# Patient Record
Sex: Male | Born: 1950 | Race: White | Hispanic: No | Smoking: Former smoker
Health system: Southern US, Community
[De-identification: ages and names within clinical notes are randomized; demographics above are authoritative.]

## PROBLEM LIST (undated history)

## (undated) DIAGNOSIS — C801 Malignant (primary) neoplasm, unspecified: Secondary | ICD-10-CM

## (undated) DIAGNOSIS — E119 Type 2 diabetes mellitus without complications: Secondary | ICD-10-CM

## (undated) DIAGNOSIS — I10 Essential (primary) hypertension: Secondary | ICD-10-CM

## (undated) DIAGNOSIS — I252 Old myocardial infarction: Secondary | ICD-10-CM

## (undated) DIAGNOSIS — E7800 Pure hypercholesterolemia, unspecified: Secondary | ICD-10-CM

## (undated) HISTORY — PX: KIDNEY SURGERY: SHX687

## (undated) HISTORY — PX: CARDIAC SURGERY: SHX584

---

## 2016-03-01 ENCOUNTER — Encounter (HOSPITAL_COMMUNITY): Payer: Self-pay | Admitting: Emergency Medicine

## 2016-03-01 ENCOUNTER — Emergency Department (HOSPITAL_COMMUNITY)

## 2016-03-01 ENCOUNTER — Emergency Department (HOSPITAL_COMMUNITY)
Admission: EM | Admit: 2016-03-01 | Discharge: 2016-03-01 | Disposition: A | Discharge status: Home / Self Care | Attending: Emergency Medicine | Admitting: Emergency Medicine

## 2016-03-01 DIAGNOSIS — Z85528 Personal history of other malignant neoplasm of kidney: Secondary | ICD-10-CM | POA: Insufficient documentation

## 2016-03-01 DIAGNOSIS — R600 Localized edema: Secondary | ICD-10-CM | POA: Diagnosis not present

## 2016-03-01 DIAGNOSIS — M79604 Pain in right leg: Secondary | ICD-10-CM | POA: Diagnosis present

## 2016-03-01 DIAGNOSIS — I10 Essential (primary) hypertension: Secondary | ICD-10-CM | POA: Diagnosis not present

## 2016-03-01 DIAGNOSIS — Z79899 Other long term (current) drug therapy: Secondary | ICD-10-CM | POA: Insufficient documentation

## 2016-03-01 DIAGNOSIS — R200 Anesthesia of skin: Secondary | ICD-10-CM | POA: Insufficient documentation

## 2016-03-01 DIAGNOSIS — E119 Type 2 diabetes mellitus without complications: Secondary | ICD-10-CM | POA: Insufficient documentation

## 2016-03-01 DIAGNOSIS — M79605 Pain in left leg: Secondary | ICD-10-CM

## 2016-03-01 DIAGNOSIS — Z794 Long term (current) use of insulin: Secondary | ICD-10-CM | POA: Diagnosis not present

## 2016-03-01 DIAGNOSIS — Z7982 Long term (current) use of aspirin: Secondary | ICD-10-CM | POA: Diagnosis not present

## 2016-03-01 HISTORY — DX: Old myocardial infarction: I25.2

## 2016-03-01 HISTORY — DX: Type 2 diabetes mellitus without complications: E11.9

## 2016-03-01 HISTORY — DX: Malignant (primary) neoplasm, unspecified: C80.1

## 2016-03-01 HISTORY — DX: Pure hypercholesterolemia, unspecified: E78.00

## 2016-03-01 HISTORY — DX: Essential (primary) hypertension: I10

## 2016-03-01 LAB — CBC WITH DIFFERENTIAL/PLATELET
Basophils Absolute: 0 10*3/uL (ref 0.0–0.1)
Basophils Relative: 0 %
Eosinophils Absolute: 0.5 10*3/uL (ref 0.0–0.7)
Eosinophils Relative: 4 %
HEMATOCRIT: 29.3 % — AB (ref 39.0–52.0)
HEMOGLOBIN: 9.7 g/dL — AB (ref 13.0–17.0)
LYMPHS ABS: 1.9 10*3/uL (ref 0.7–4.0)
Lymphocytes Relative: 18 %
MCH: 28.5 pg (ref 26.0–34.0)
MCHC: 33.1 g/dL (ref 30.0–36.0)
MCV: 86.2 fL (ref 78.0–100.0)
MONOS PCT: 6 %
Monocytes Absolute: 0.6 10*3/uL (ref 0.1–1.0)
NEUTROS PCT: 72 %
Neutro Abs: 7.6 10*3/uL (ref 1.7–7.7)
Platelets: 173 10*3/uL (ref 150–400)
RBC: 3.4 MIL/uL — ABNORMAL LOW (ref 4.22–5.81)
RDW: 15 % (ref 11.5–15.5)
WBC: 10.7 10*3/uL — ABNORMAL HIGH (ref 4.0–10.5)

## 2016-03-01 LAB — URINALYSIS, ROUTINE W REFLEX MICROSCOPIC
BILIRUBIN URINE: NEGATIVE
Bacteria, UA: NONE SEEN
Glucose, UA: NEGATIVE mg/dL
Ketones, ur: NEGATIVE mg/dL
LEUKOCYTES UA: NEGATIVE
Nitrite: NEGATIVE
PH: 5 (ref 5.0–8.0)
Protein, ur: 30 mg/dL — AB
SPECIFIC GRAVITY, URINE: 1.009 (ref 1.005–1.030)

## 2016-03-01 LAB — COMPREHENSIVE METABOLIC PANEL
ALK PHOS: 196 U/L — AB (ref 38–126)
ALT: 24 U/L (ref 17–63)
ANION GAP: 9 (ref 5–15)
AST: 21 U/L (ref 15–41)
Albumin: 3.1 g/dL — ABNORMAL LOW (ref 3.5–5.0)
BILIRUBIN TOTAL: 0.4 mg/dL (ref 0.3–1.2)
BUN: 44 mg/dL — ABNORMAL HIGH (ref 6–20)
CALCIUM: 8.9 mg/dL (ref 8.9–10.3)
CO2: 19 mmol/L — ABNORMAL LOW (ref 22–32)
Chloride: 108 mmol/L (ref 101–111)
Creatinine, Ser: 2.27 mg/dL — ABNORMAL HIGH (ref 0.61–1.24)
GFR calc non Af Amer: 29 mL/min — ABNORMAL LOW (ref >60)
GFR, EST AFRICAN AMERICAN: 34 mL/min — AB (ref >60)
Glucose, Bld: 110 mg/dL — ABNORMAL HIGH (ref 65–99)
Potassium: 4.1 mmol/L (ref 3.5–5.1)
Sodium: 136 mmol/L (ref 135–145)
TOTAL PROTEIN: 7.5 g/dL (ref 6.5–8.1)

## 2016-03-01 LAB — BRAIN NATRIURETIC PEPTIDE: B Natriuretic Peptide: 62 pg/mL (ref 0.0–100.0)

## 2016-03-01 MED ORDER — FUROSEMIDE 40 MG PO TABS
40.0000 mg | ORAL_TABLET | Freq: Once | ORAL | Status: DC
  Filled 2016-03-01: qty 1

## 2016-03-01 NOTE — ED Triage Notes (Signed)
Pain to bilateral legs x 1 1/2 weeks ago,after receiving rocephin shots in bilateral buttocks.  Given abx d/t swelling in lt testicle

## 2016-03-01 NOTE — ED Provider Notes (Signed)
[facility] DEPT Provider Note   CSN: [id number] Arrival date & time: 03/01/16  1550     History   Chief Complaint Chief Complaint  Patient presents with  . Leg Pain    HPI Kirk Wong is a 65 y.o. male.  The history is provided by the patient. No language interpreter was used.  Leg Pain   This is a new problem. The current episode started 2 days ago. The problem occurs constantly. The problem has been rapidly worsening. The pain is present in the right lower leg and left upper leg. The quality of the pain is described as aching. The pain is moderate. Associated symptoms include numbness and limited range of motion. He has tried nothing for the symptoms. The treatment provided no relief.   Pt reports he had a scrotal infection a month ago.  Pt reports he had a shot of rocephin 10 days ago,  Pt reports he had a shot in each buttock.  Pt report he has weakness in his right leg.  Pt reports right arm felt weak earlier.  Pt complains of urinary incontinence earlier today.  Pt reports decreased appetite since infection and taking antibiotics.  Past Medical History:  Diagnosis Date  . Cancer ([place])    lt kidney removed d/t cancer   . Diabetes mellitus without complication ([place])   . Hypercholesteremia   . Hypertension   . 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   Past Surgical History:  Procedure Laterality Date  . CARDIAC SURGERY     4 stents  . KIDNEY SURGERY     lt kidney removed       Home Medications    Prior to Admission medications   Medication Sig Start Date End Date Taking? Authorizing Provider  amLODipine (NORVASC) 5 MG tablet Take 5 mg by mouth daily.   Yes Historical Provider, MD  aspirin EC 81 MG tablet Take 81 mg by mouth daily.   Yes Historical Provider, MD  clopidogrel (PLAVIX) 75 MG tablet Take 75 mg by mouth daily.   Yes Historical Provider, MD  dimenhyDRINATE (DRAMAMINE) 50 MG tablet Take 100 mg  by mouth 2 (two) times daily.   Yes Historical Provider, MD  furosemide (LASIX) 20 MG tablet Take 60 mg by mouth 2 (two) times a week. Mondays and Fridays only   Yes Historical Provider, MD  gemfibrozil (LOPID) 600 MG tablet Take 600 mg by mouth daily.   Yes Historical Provider, MD  insulin NPH-regular Human (NOVOLIN 70/30) (70-30) 100 UNIT/ML injection Inject 20-38 Units into the skin 2 (two) times daily. 38 units in the morning and 20 units in the evening   Yes Historical Provider, MD  losartan (COZAAR) 100 MG tablet Take 100 mg by mouth daily.   Yes Historical Provider, MD  metoprolol (LOPRESSOR) 50 MG tablet Take 50 mg by mouth 2 (two) times daily.   Yes Historical Provider, MD  Omega-3 Fatty Acids (FISH OIL) 1000 MG CAPS Take 2 capsules by mouth 2 (two) times daily.   Yes Historical Provider, MD  sodium bicarbonate 650 MG tablet Take 650 mg by mouth 2 (two) times daily.   Yes Historical Provider, MD    Family History No family history on file.  Social History Social History  Substance Use Topics  . Smoking status: Former Research scientist (life sciences)  . Smokeless tobacco: Never Used     Comment: quit in 2004  .  Alcohol use No     Allergies   Patient has no known allergies.   Review of Systems Review of Systems  Neurological: Positive for numbness.  All other systems reviewed and are negative.    Physical Exam Updated Vital Signs BP 177/81 (BP Location: Left Wrist)   Pulse 81   Temp 98.4 F (36.9 C) (Oral)   Resp 18   Ht 5\' 10"  (1.778 m)   Wt 104.3 kg   SpO2 99%   BMI 33.00 kg/m   Physical Exam  Constitutional: He is oriented to person, place, and time. He appears well-developed and well-nourished.  HENT:  Head: Normocephalic and atraumatic.  Nose: Nose normal.  Mouth/Throat: Oropharynx is clear and moist.  Eyes: Conjunctivae are normal. Pupils are equal, round, and reactive to light.  Neck: Neck supple.  Cardiovascular: Normal rate and regular rhythm.   No murmur  heard. Pulmonary/Chest: Effort normal and breath sounds normal. No respiratory distress.  Abdominal: Soft. There is no tenderness.  Musculoskeletal: He exhibits no edema.  Neurological: He is alert and oriented to person, place, and time. He displays normal reflexes. No cranial nerve deficit or sensory deficit. He exhibits normal muscle tone. Coordination normal.  Skin: Skin is warm and dry.  Psychiatric: He has a normal mood and affect.  Nursing note and vitals reviewed.    ED Treatments / Results  Labs (all labs ordered are listed, but only abnormal results are displayed) Labs Reviewed  CBC WITH DIFFERENTIAL/PLATELET - Abnormal; Notable for the following:       Result Value   WBC 10.7 (*)    RBC 3.40 (*)    Hemoglobin 9.7 (*)    HCT 29.3 (*)    All other components within normal limits  COMPREHENSIVE METABOLIC PANEL - Abnormal; Notable for the following:    CO2 19 (*)    Glucose, Bld 110 (*)    BUN 44 (*)    Creatinine, Ser 2.27 (*)    Albumin 3.1 (*)    Alkaline Phosphatase 196 (*)    GFR calc non Af Amer 29 (*)    GFR calc Af Amer 34 (*)    All other components within normal limits  BRAIN NATRIURETIC PEPTIDE  URINALYSIS, ROUTINE W REFLEX MICROSCOPIC    EKG  EKG Interpretation  Date/Time:  Sunday March 01 2016 16:59:47 EST Ventricular Rate:  83 PR Interval:    QRS Duration: 92 QT Interval:  350 QTC Calculation: 412 R Axis:   32 Text Interpretation:  Sinus rhythm Borderline T abnormalities, inferior leads Baseline wander in lead(s) V6 No STEMI.  Confirmed by LONG MD, JOSHUA [phone]) on 03/01/2016 5:35:46 PM       Radiology Ct Head Wo Contrast  Result Date: 03/01/2016 CLINICAL DATA:  Bilateral leg pain for several weeks, initial encounter EXAM: CT HEAD WITHOUT CONTRAST TECHNIQUE: Contiguous axial images were obtained from the base of the skull through the vertex without intravenous contrast. COMPARISON:  None. FINDINGS: Brain: No evidence of acute infarction,  hemorrhage, hydrocephalus, extra-axial collection or mass lesion/mass effect. Vascular: No hyperdense vessel or unexpected calcification. Skull: Normal. Negative for fracture or focal lesion. Sinuses/Orbits: No acute finding. Other: None. IMPRESSION: No acute intracranial abnormality noted. Electronically Signed   By: Inez Catalina M.D.   On: 03/01/2016 17:45   Ct Thoracic Spine Wo Contrast  Result Date: 03/01/2016 CLINICAL DATA:  Bilateral leg pain since 1-1/2 weeks ago after receiving Rocephin shots in both buttocks. Numbness in the right leg. EXAM: CT  THORACIC SPINE WITHOUT CONTRAST; CT LUMBAR SPINE WITHOUT CONTRAST TECHNIQUE: Multidetector CT images of the thoracic were obtained using the standard protocol without intravenous contrast. COMPARISON:  None. FINDINGS: Alignment: Normal. Vertebrae: No acute fracture or focal pathologic process. Small osteophytes noted anteriorly along the thoracolumbar spine, more prominently from L3 through L5. Paraspinal and other soft tissues: No paraspinal hematoma. There is aortic atherosclerosis of the thoracic and abdominal aorta is with mild fusiform ectasia of the infrarenal abdominal aorta. There is extension of atherosclerosis into the common iliac arteries and branch vessels. Disc levels: No thoracic disc herniation or canal stenosis. No spinal canal hemorrhage or focal mass. No focal disc herniation or significant canal stenosis from L1 through L3. L3-4 mild central disc bulge without significant canal stenosis. No significant neural foraminal stenosis. L4-5 mild central disc bulge without significant canal nor neural foraminal stenosis. At L5-S1 mild central disc bulge without significant canal or neural foraminal stenosis. IMPRESSION: No acute osseous abnormality of the thoracolumbar spine. Mild central disc bulges from L3 through S1 without significant neural foraminal or central canal stenosis. Aortoiliac and branch vessel atherosclerosis. Electronically Signed    By: Ashley Royalty M.D.   On: 03/01/2016 18:27   Ct Lumbar Spine Wo Contrast  Result Date: 03/01/2016 CLINICAL DATA:  Bilateral leg pain since 1-1/2 weeks ago after receiving Rocephin shots in both buttocks. Numbness in the right leg. EXAM: CT THORACIC SPINE WITHOUT CONTRAST; CT LUMBAR SPINE WITHOUT CONTRAST TECHNIQUE: Multidetector CT images of the thoracic were obtained using the standard protocol without intravenous contrast. COMPARISON:  None. FINDINGS: Alignment: Normal. Vertebrae: No acute fracture or focal pathologic process. Small osteophytes noted anteriorly along the thoracolumbar spine, more prominently from L3 through L5. Paraspinal and other soft tissues: No paraspinal hematoma. There is aortic atherosclerosis of the thoracic and abdominal aorta is with mild fusiform ectasia of the infrarenal abdominal aorta. There is extension of atherosclerosis into the common iliac arteries and branch vessels. Disc levels: No thoracic disc herniation or canal stenosis. No spinal canal hemorrhage or focal mass. No focal disc herniation or significant canal stenosis from L1 through L3. L3-4 mild central disc bulge without significant canal stenosis. No significant neural foraminal stenosis. L4-5 mild central disc bulge without significant canal nor neural foraminal stenosis. At L5-S1 mild central disc bulge without significant canal or neural foraminal stenosis. IMPRESSION: No acute osseous abnormality of the thoracolumbar spine. Mild central disc bulges from L3 through S1 without significant neural foraminal or central canal stenosis. Aortoiliac and branch vessel atherosclerosis. Electronically Signed   By: Ashley Royalty M.D.   On: 03/01/2016 18:27    Procedures Procedures (including critical care time)  Medications Ordered in ED Medications - No data to display   Initial Impression / Assessment and Plan / ED Course  I have reviewed the triage vital signs and the nursing notes.  Pertinent labs & imaging  results that were available during my care of the patient were reviewed by me and considered in my medical decision making (see chart for details).  Clinical Course     Dr. Laverta Baltimore in to see and examine.   Labs compaired with old labs.  Creatine in pt's usual range.  Ct show bulging disc.  No nerve impingement.    Final Clinical Impressions(s) / ED Diagnoses   Final diagnoses:  Bilateral edema of lower extremity  Right leg pain  Left leg pain    New Prescriptions New Prescriptions   No medications on file  Fransico Meadow, PA-C 03/01/16 [place], PA-C 03/01/16 [place], MD 03/03/16 [phone]

## 2016-03-01 NOTE — ED Notes (Signed)
Pt refused to take lasix. Pt states he "didn't want to be up all night urinating."

## 2016-03-01 NOTE — ED Notes (Signed)
Pt wheeled from ER in police custody. Pt verbalized understanding of discharge instructions.

## 2016-04-09 DEATH — deceased

## 2017-09-19 IMAGING — CT CT L SPINE W/O CM
2 of 3 series · 9 of 33 positions shown, 11 images · non-contrast
Comparison: None.

CLINICAL DATA: Bilateral leg pain since [DATE] weeks ago after
receiving Rocephin shots in both buttocks. Numbness in the right
leg.

EXAM:
CT THORACIC SPINE WITHOUT CONTRAST; CT LUMBAR SPINE WITHOUT CONTRAST
TECHNIQUE: Multidetector CT images of the thoracic were obtained using the
standard protocol without intravenous contrast.

[Series 4: l spine soft · axial · 0.33mm/px · z∈[-498,-108]mm · 6 of 255 slices shown, 8 images]
[im 40/255  soft-tissue]
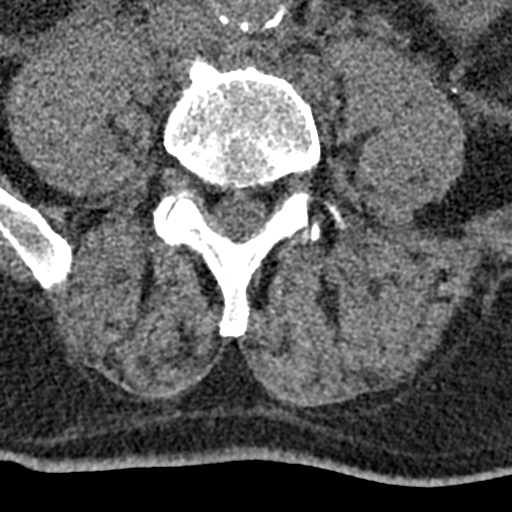
[im 40/255  bone]
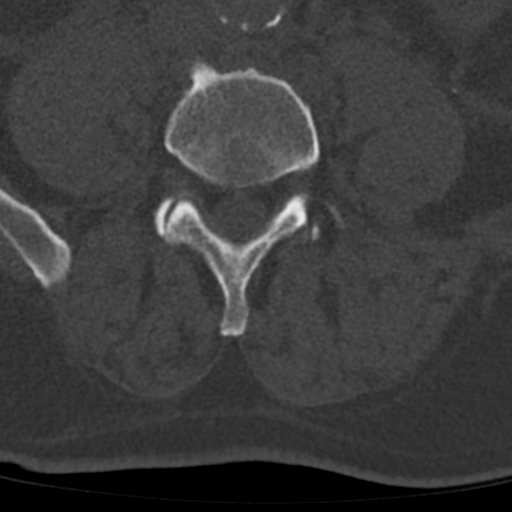
[im 79/255  bone]
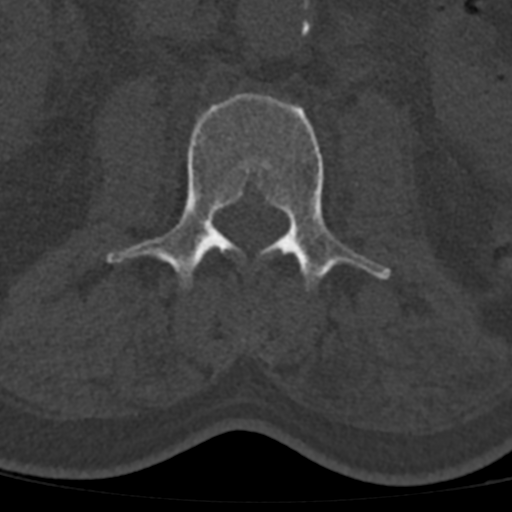
[im 118/255  bone]
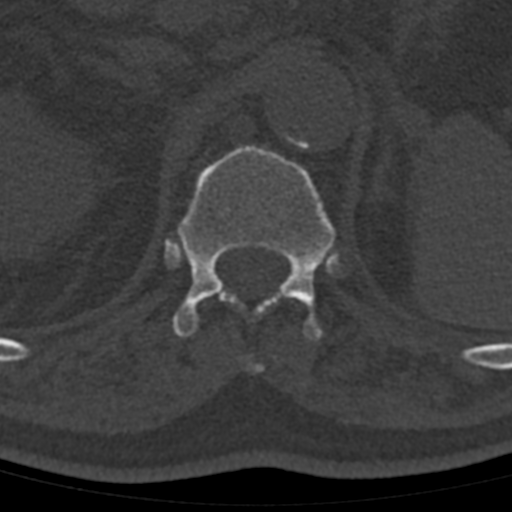
[im 157/255  bone]
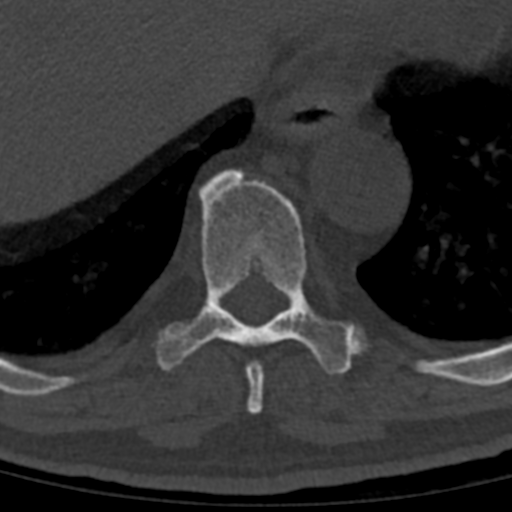
[im 196/255  soft-tissue]
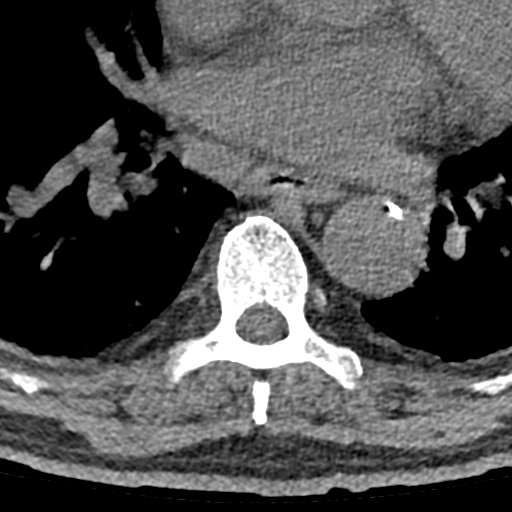
[im 196/255  bone]
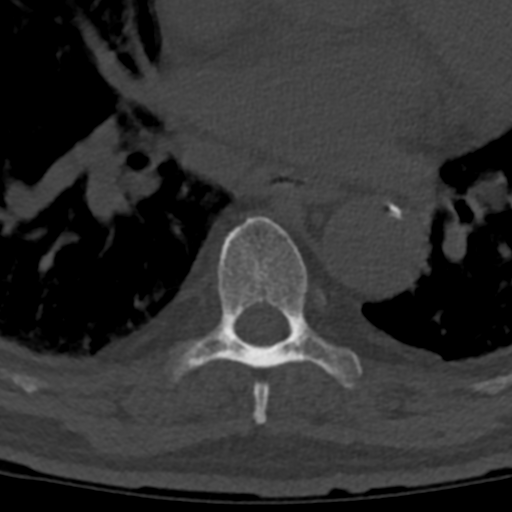
[im 235/255  bone]
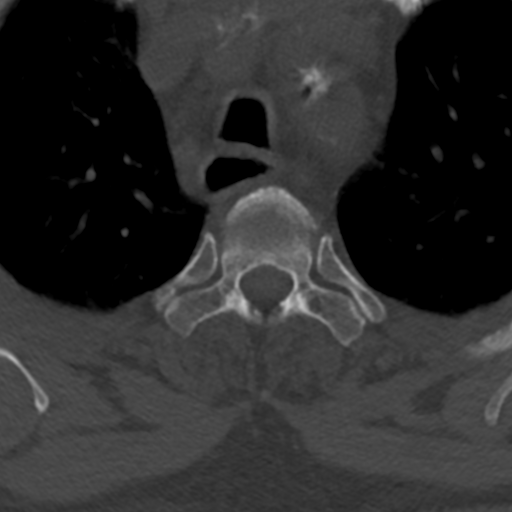

[Series 6: coronal bone · coronal · 0.24mm/px · 3 of 74 slices shown]
[im 15/74  bone]
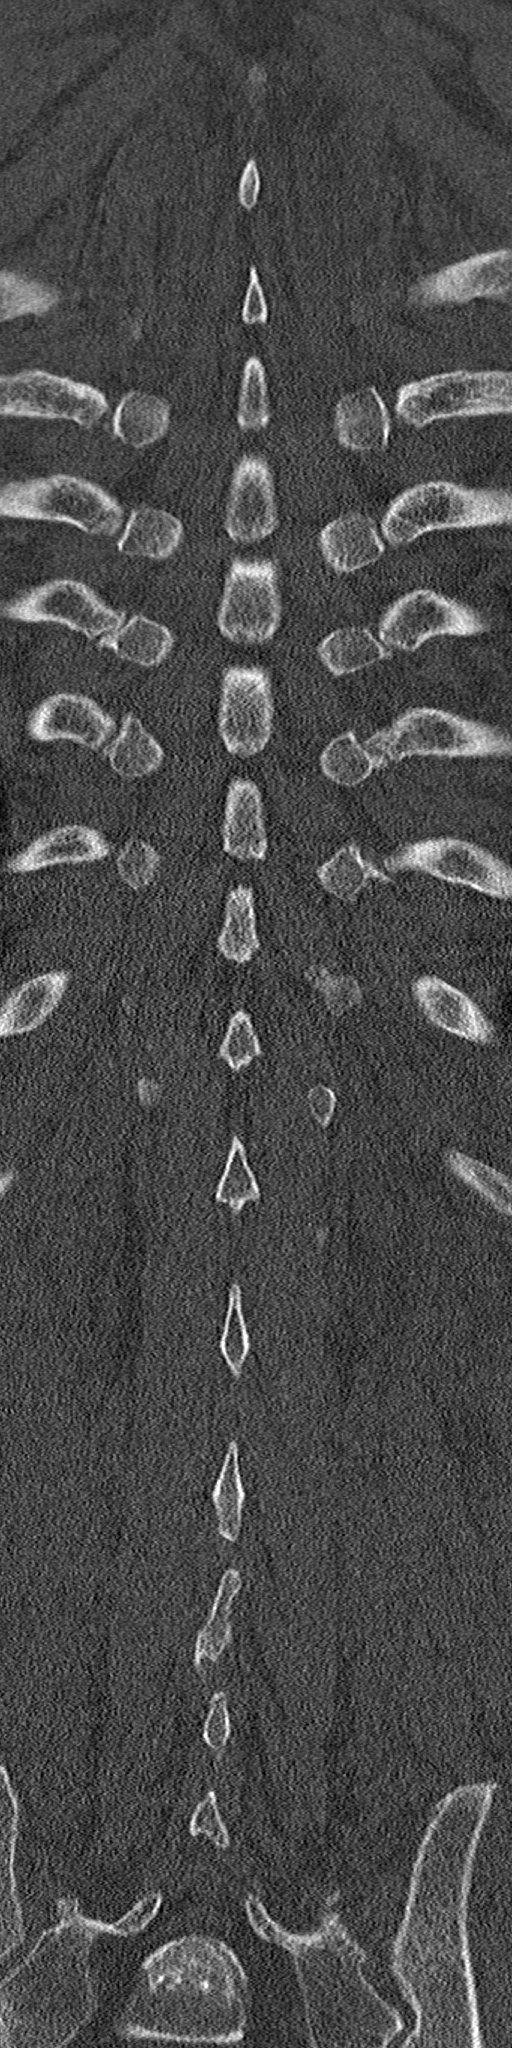
[im 30/74  bone]
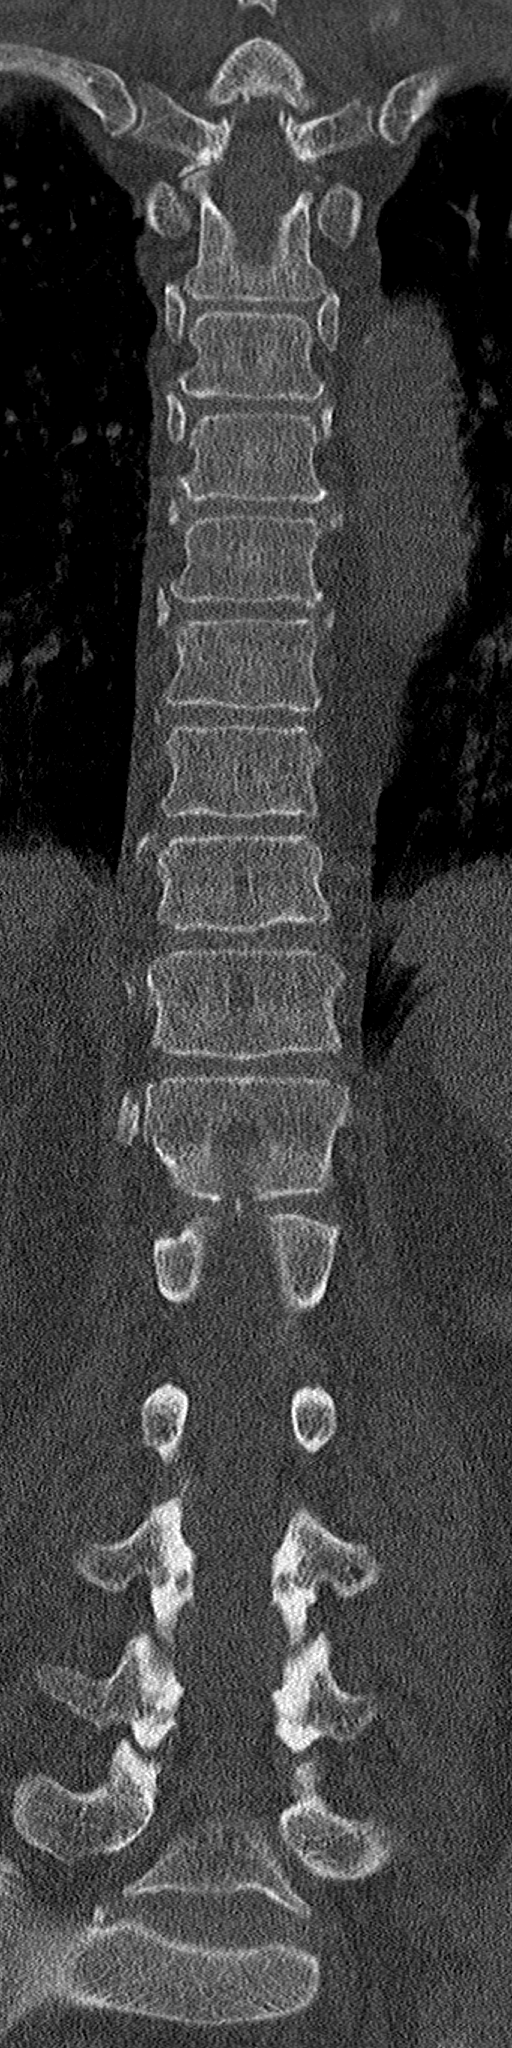
[im 44/74  bone]
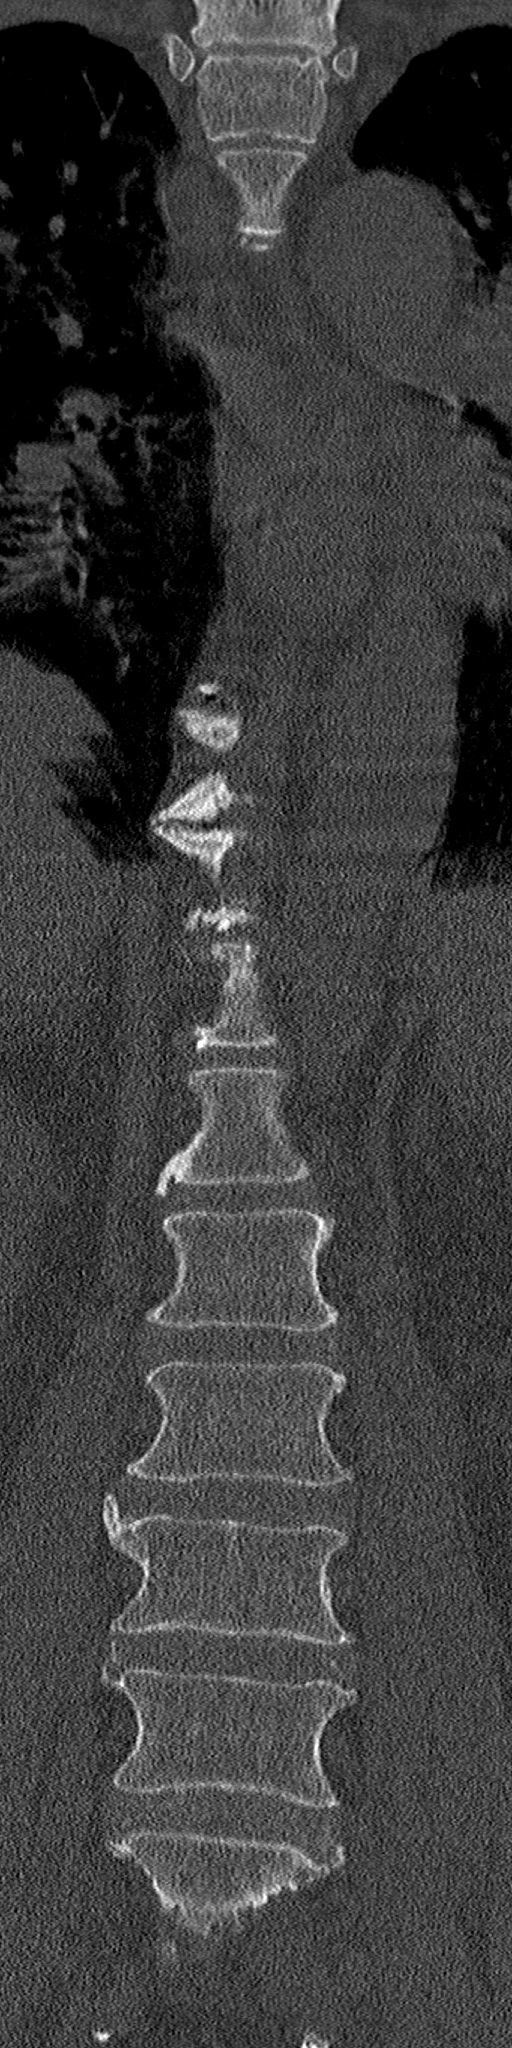

[9 of 33 positions shown; findings below may reference images not displayed]

FINDINGS: Alignment: Normal.

Vertebrae: No acute fracture or focal pathologic process. Small
osteophytes noted anteriorly along the thoracolumbar spine, more
prominently from L3 through L5.

Paraspinal and other soft tissues: No paraspinal hematoma. There is
aortic atherosclerosis of the thoracic and abdominal aorta is with
mild fusiform ectasia of the infrarenal abdominal aorta. There is
extension of atherosclerosis into the common iliac arteries and
branch vessels.

Disc levels: No thoracic disc herniation or canal stenosis. No
spinal canal hemorrhage or focal mass.

No focal disc herniation or significant canal stenosis from L1
through L3.

L3-4 mild central disc bulge without significant canal stenosis. No
significant neural foraminal stenosis.

L4-5 mild central disc bulge without significant canal nor neural
foraminal stenosis.

At L5-S1 mild central disc bulge without significant canal or neural
foraminal stenosis.
IMPRESSION: No acute osseous abnormality of the thoracolumbar spine.

Mild central disc bulges from L3 through S1 without significant
neural foraminal or central canal stenosis.

Aortoiliac and branch vessel atherosclerosis.
